# Patient Record
Sex: Male | Born: 1980 | Race: White | Hispanic: No | Marital: Married | Smoking: Never smoker
Health system: Southern US, Community
[De-identification: ages and names within clinical notes are randomized; demographics above are authoritative.]

## PROBLEM LIST (undated history)

## (undated) DIAGNOSIS — K654 Sclerosing mesenteritis: Secondary | ICD-10-CM

## (undated) DIAGNOSIS — F419 Anxiety disorder, unspecified: Secondary | ICD-10-CM

## (undated) HISTORY — PX: HERNIA REPAIR: SHX51

---

## 2020-01-26 ENCOUNTER — Encounter (HOSPITAL_BASED_OUTPATIENT_CLINIC_OR_DEPARTMENT_OTHER): Payer: Self-pay

## 2020-01-26 ENCOUNTER — Emergency Department (HOSPITAL_BASED_OUTPATIENT_CLINIC_OR_DEPARTMENT_OTHER): Payer: Managed Care, Other (non HMO) | Attending: Physician Assistant

## 2020-01-26 ENCOUNTER — Other Ambulatory Visit: Payer: Self-pay

## 2020-01-26 ENCOUNTER — Emergency Department (HOSPITAL_BASED_OUTPATIENT_CLINIC_OR_DEPARTMENT_OTHER)
Admission: EM | Admit: 2020-01-26 | Discharge: 2020-01-26 | Disposition: A | Discharge status: Home / Self Care | Payer: Worker's Compensation | Attending: Emergency Medicine | Admitting: Emergency Medicine

## 2020-01-26 DIAGNOSIS — Z79899 Other long term (current) drug therapy: Secondary | ICD-10-CM | POA: Diagnosis not present

## 2020-01-26 DIAGNOSIS — R42 Dizziness and giddiness: Secondary | ICD-10-CM | POA: Diagnosis not present

## 2020-01-26 DIAGNOSIS — Y929 Unspecified place or not applicable: Secondary | ICD-10-CM | POA: Insufficient documentation

## 2020-01-26 DIAGNOSIS — S0990XA Unspecified injury of head, initial encounter: Secondary | ICD-10-CM

## 2020-01-26 DIAGNOSIS — Y939 Activity, unspecified: Secondary | ICD-10-CM | POA: Diagnosis not present

## 2020-01-26 DIAGNOSIS — Y990 Civilian activity done for income or pay: Secondary | ICD-10-CM | POA: Diagnosis not present

## 2020-01-26 DIAGNOSIS — W228XXA Striking against or struck by other objects, initial encounter: Secondary | ICD-10-CM | POA: Insufficient documentation

## 2020-01-26 DIAGNOSIS — S0081XA Abrasion of other part of head, initial encounter: Secondary | ICD-10-CM | POA: Insufficient documentation

## 2020-01-26 HISTORY — DX: Sclerosing mesenteritis: K65.4

## 2020-01-26 HISTORY — DX: Anxiety disorder, unspecified: F41.9

## 2020-01-26 MED ORDER — ACETAMINOPHEN 325 MG PO TABS
650.0000 mg | ORAL_TABLET | Freq: Once | ORAL | Status: AC
  Administered 2020-01-26: 15:00:00 650 mg via ORAL
  Filled 2020-01-26: qty 2

## 2020-01-26 NOTE — ED Provider Notes (Signed)
[place] EMERGENCY DEPARTMENT Provider Note   CSN: [id number] Arrival date & time: 01/26/20  1234     History Chief Complaint  Patient presents with  . Head Injury    Vincent Stone is a 39 y.o. male with past medical history significant for mesenteric panniculitis presents to emergency department today with chief complaint of head injury. Onset was acute happening just prior to arrival. Patient states he was at work wearing a hard hat when he lost control of a drill and it hit him in his left forehead. That caused him to be off balance and he fell hitting the right side of his face on a forklift. He denies falling to the ground. He reports a headache started immediately after the injury and has progressively worsened. He rates the pain 7/10 in severity. He is also endorsing pain form the right temple radiating to the right mandible that is worse with movement. He describes pain as an aching sensation. He reports feeling dizzy when ambulating after the injury.  He has not taken any medications for pain prior to arrival. He denies any neck pain, blurry vision, jaw malocclusion, nausea, vomiting, weakness, numbness. He reports tetanus is UTD. Patient is not anticoagulated.  History provided by patient with additional history obtained from chart review.     Past Medical History:  Diagnosis Date  . Anxiety   . Mesenteric panniculitis ([place])     There are no problems to display for this patient.   Past Surgical History:  Procedure Laterality Date  . HERNIA REPAIR         No family history on file.  Social History   Tobacco Use  . Smoking status: Never Smoker  . Smokeless tobacco: Never Used  Substance Use Topics  . Alcohol use: Never  . Drug use: Never    Home Medications Prior to Admission medications   Medication Sig Start Date End Date Taking? Authorizing Provider  Adalimumab (HUMIRA PEN) 40 MG/0.4ML PNKT Inject into the skin. 12/13/19  Yes [provider]  escitalopram (LEXAPRO) 20 MG tablet Take by mouth. 09/20/19  Yes [provider]  dicyclomine (BENTYL) 10 MG capsule Take by mouth.    [provider]  hyoscyamine (ANASPAZ) 0.125 MG TBDP disintergrating tablet Take by mouth.    [provider]  naproxen sodium (ALEVE) 220 MG tablet Take by mouth.    [provider]  ondansetron (ZOFRAN) 8 MG tablet Take 8 mg by mouth every 8 (eight) hours as needed. 12/13/19   [provider]  predniSONE (DELTASONE) 10 MG tablet PREDNISONE 4 TABS IN THE MORNING X 3 DAYS, 3 TABS X 3 DAYS, 2 TABSX 3 DAYS, 1 TABX 3 DAYS AND STOP 01/05/20   [provider]    Allergies    Patient has no known allergies.  Review of Systems   Review of Systems All other systems are reviewed and are negative for acute change except as noted in the HPI.  Physical Exam Updated Vital Signs BP (!) 146/109 (BP Location: Left Arm)   Pulse 79   Temp 98.1 F (36.7 C) (Oral)   Resp 18   Ht 5\' 6"  (1.676 m)   Wt 99.8 kg   SpO2 99%   BMI 35.51 kg/m   Physical Exam Vitals and nursing note reviewed.  Constitutional:      Appearance: He is well-developed. He is not ill-appearing or toxic-appearing.  HENT:     Head: Normocephalic and atraumatic.  Nose: Nose normal.  Eyes:     General: No scleral icterus.       Right eye: No discharge.        Left eye: No discharge.     Conjunctiva/sclera: Conjunctivae normal.  Neck:     Vascular: No JVD.     Comments: Full ROM intact without spinous process TTP. No bony stepoffs or deformities, no paraspinous muscle TTP or muscle spasms. No rigidity or meningeal signs. No bruising, erythema, or swelling.  Cardiovascular:     Rate and Rhythm: Normal rate and regular rhythm.     Pulses: Normal pulses.     Heart sounds: Normal heart sounds.  Pulmonary:     Effort: Pulmonary effort is normal.     Breath sounds: Normal breath sounds.  Abdominal:     General: There is no distension.   Musculoskeletal:        General: Normal range of motion.     Cervical back: Normal range of motion.  Skin:    General: Skin is warm and dry.     Capillary Refill: Capillary refill takes less than 2 seconds.  Neurological:     Mental Status: He is oriented to person, place, and time.     GCS: GCS eye subscore is 4. GCS verbal subscore is 5. GCS motor subscore is 6.     Comments: Fluent speech, no facial droop.  Speech is clear and goal oriented, follows commands CN III-XII intact, no facial droop Normal strength in upper and lower extremities bilaterally including dorsiflexion and plantar flexion, strong and equal grip strength Sensation normal to light and sharp touch Moves extremities without ataxia, coordination intact Normal finger to nose and rapid alternating movements Normal gait and balance   Psychiatric:        Behavior: Behavior normal.     ED Results / Procedures / Treatments   Labs (all labs ordered are listed, but only abnormal results are displayed) Labs Reviewed - No data to display  EKG None  Radiology CT Head Wo Contrast  Result Date: 01/26/2020 CLINICAL DATA:  Head trauma EXAM: CT HEAD WITHOUT CONTRAST TECHNIQUE: Contiguous axial images were obtained from the base of the skull through the vertex without intravenous contrast. COMPARISON:  None. FINDINGS: Brain: There is no acute intracranial hemorrhage, mass-effect, or edema. Gray-white differentiation is preserved. There is no extra-axial fluid collection. Ventricles and sulci are within normal limits in size and configuration. Vascular: No hyperdense vessel or unexpected calcification. Skull: Calvarium is unremarkable. Sinuses/Orbits: No acute finding. Other: None. IMPRESSION: No evidence of acute intracranial injury. Electronically Signed   By: Guadlupe Spanish M.D.   On: 01/26/2020 15:14   CT Maxillofacial Wo Contrast  Result Date: 01/26/2020 CLINICAL DATA:  Facial trauma EXAM: CT MAXILLOFACIAL WITHOUT  CONTRAST TECHNIQUE: Multidetector CT imaging of the maxillofacial structures was performed. Multiplanar CT image reconstructions were also generated. COMPARISON:  None. FINDINGS: Osseous: There is no acute fracture. Temporomandibular joints are unremarkable. Orbits: No intraorbital hematoma. Sinuses: Minimal mucosal thickening. Soft tissues: Unremarkable. Limited intracranial: See separate dictation. IMPRESSION: No acute facial fracture. Electronically Signed   By: Guadlupe Spanish M.D.   On: 01/26/2020 15:18    Procedures Procedures (including critical care time)  Medications Ordered in ED Medications  acetaminophen (TYLENOL) tablet 650 mg (650 mg Oral Given 01/26/20 1445)    ED Course  I have reviewed the triage vital signs and the nursing notes.  Pertinent labs & imaging results that were available during my care of  the patient were reviewed by me and considered in my medical decision making (see chart for details).    MDM Rules/Calculators/A&P                      Patient seen and examined. Patient presents awake, alert, hemodynamically stable, afebrile, non toxic. He has headache and facial pain after trauma.  On exam he has a very small superficial abrasion to right forehead without bleeding.  He states his tetanus is up-to-date.  Neuro exam is normal.  He does have tenderness palpation over his right temple and has pain when opening and closing his mouth.  No malocclusion.  No loose teeth noted.  Does have mild swelling noted to the right side of his face.He denies fall and LOC, but with concerning mechanism will proceed with CT head. With concern for mandible injury given the amount of tenderness he has on exam, will also get CT maxillofacial . Ct head and maxillofacial are negative for acute findings. No fractures seen. Patient given tylenol for pain.  The patient appears reasonably screened and/or stabilized for discharge and I doubt any other medical condition or other [facility] requiring  further screening, evaluation, or treatment in the ED at this time prior to discharge. The patient is safe for discharge with strict return precautions discussed. Recommend pcp/concussion clinic follow up.   Portions of this note were generated with Scientist, clinical (histocompatibility and immunogenetics). Dictation errors may occur despite best attempts at proofreading.    Final Clinical Impression(s) / ED Diagnoses Final diagnoses:  Injury of head, initial encounter    Rx / DC Orders ED Discharge Orders    None       Kathyrn Lass 01/26/20 1541    Raeford Razor, MD 01/27/20 1116

## 2020-01-26 NOTE — Discharge Instructions (Addendum)
You were seen in the emergency department today following a head injury.  We suspect that you have a concussion, otherwise known and as a mild traumatic brain injury.  Your  CT scan did not show any new abnormality such as a brain bleed.   1. Medications: Ibuprofen or Tylenol for pain 2. Treatment: Rest, ice on head.  Concussion precautions given - keep patient in a quiet, not simulating, dark environment. No TV, computer use, video games until headache is resolved completely. No contact sports until cleared by the primary care provider or pediatrician. 3. Follow Up: With primary care physician in 2-3 days if headache persists.  Return to the emergency department if patient becomes lethargic, begins vomiting , develops double vision, speech difficulty, problems walking or other change in mental status.  We would like you to follow-up with the [facility] [place] sports [facility] concussion clinic, contact information below:  Address: [address], [place], [place] [address] Phone: [phone]  Per [facility] Website:   What to Expect: Evaluations at the Concussion Clinic All patients at the Concussion Clinic are given an extensive three-part evaluation that includes: a computerized test to measure memory, visual processing speed, and reaction time a test that measures the systems that integrate movement, balance, and vision an in-depth review of a detailed symptoms checklist for signs of concussion The evaluation process is critical for the treatment and recovery of a concussed patient as no two concussions are alike. Thankfully, the diagnostic tools that trained professionals use can help to better manage head injuries.  Part of the technology the doctors and staff at [facility] use in their assessments is a computerized examination called ImPACT. This tool uses six tasks to measure memory, visual processing speed, and reaction time. By analyzing the results of the  examination and comparing them to average responses or a baseline score for a patient, our staff can make an informed judgment about the patient's cognitive functions. In addition to the ImPACT examination, patients are given a Vestibular Ocular Motor Screening test. This is a simple and painless test that focuses on the systems that integrate a patient's movement, balance, and vision.  These tests are used in conjunction with a thorough review of a detailed symptoms checklist to complete the patient's evaluation and develop a treatment plan.  You do not need a referral, and you can book an appointment online. Our Concussion Hotline is staffed by trained professionals during our regular office hours: Monday - Thursday from 7:30 AM to 4:30 PM, and Fridays from 7:30 AM to 12:00 PM, and the number to call is [phone]. The Concussion Clinic team meets with patients at our [facility] office. Call today.   Further ED Instructions:   Please call and follow-up within the concussion clinic as well as your primary care provider within the next 3 to 5 days.  In the meantime we would like you to avoid strenuous/over exertional activities such as sports or running.  Please avoid excess screen time utilizing cell phones, computers, or the TV.  Please avoid activities that require significant amount of concentration.  Please try to rest as much as possible.  Please take Tylenol and/or Motrin per over-the-counter dosing instructions for any continued discomfort.  Return to the ER for new or worsening symptoms or any other concerns that you may have.

## 2020-01-26 NOTE — ED Triage Notes (Signed)
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

## 2021-08-23 IMAGING — CT CT HEAD W/O CM
3 series · 16 of 47 positions shown, 19 images · non-contrast
Comparison: None.

CLINICAL DATA: Head trauma

EXAM:
CT HEAD WITHOUT CONTRAST
TECHNIQUE: Contiguous axial images were obtained from the base of the skull
through the vertex without intravenous contrast.

[Series 2: head wo · axial · 0.46mm/px · z∈[-201,-56]mm · 10 of 35 slices shown, 13 images]
[im 3/35  brain]
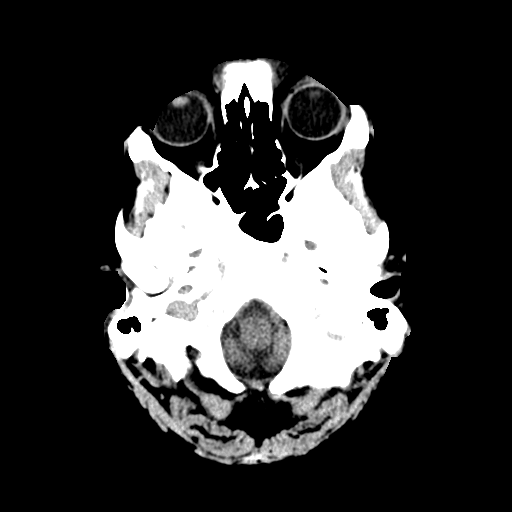
[im 3/35  bone]
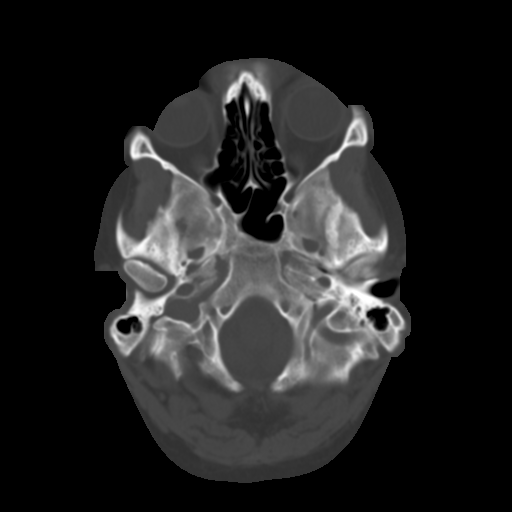
[im 6/35  brain]
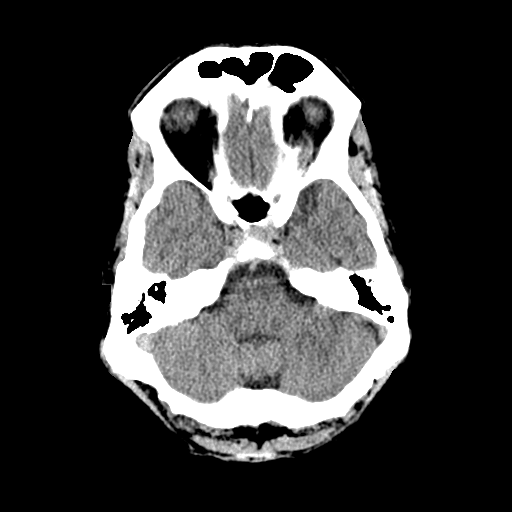
[im 10/35  brain]
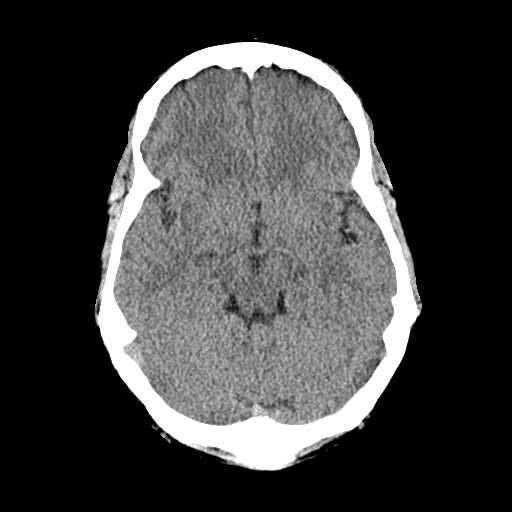
[im 12/35  brain]
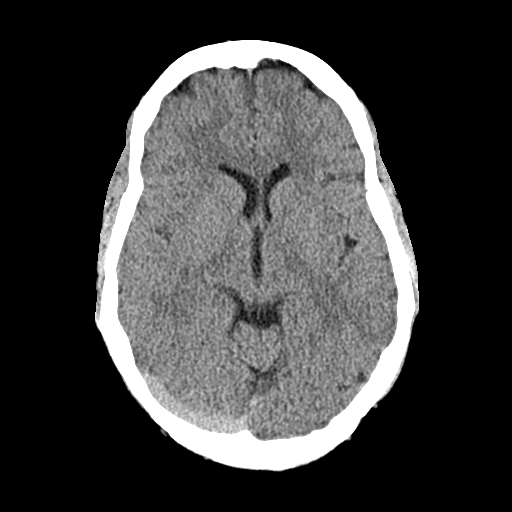
[im 16/35  brain]
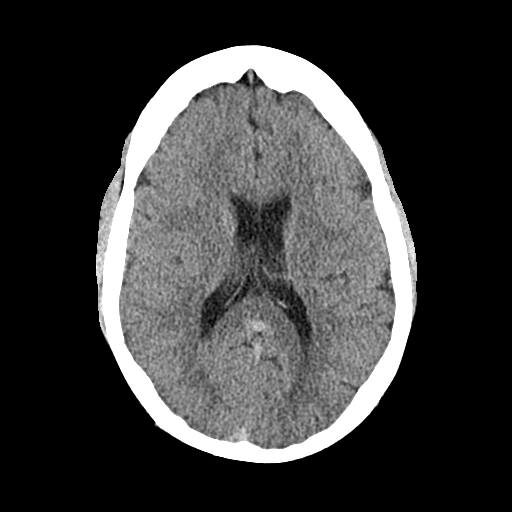
[im 16/35  bone]
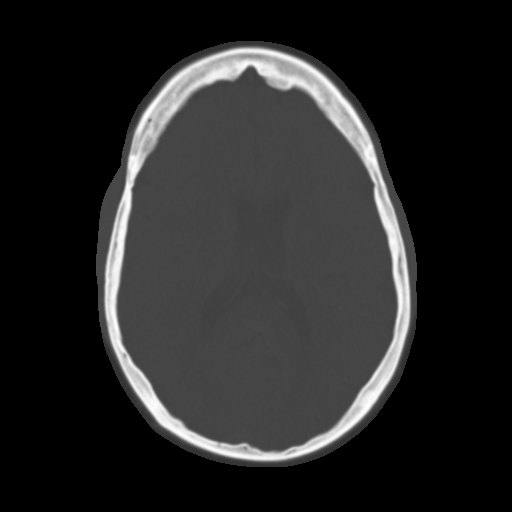
[im 19/35  brain]
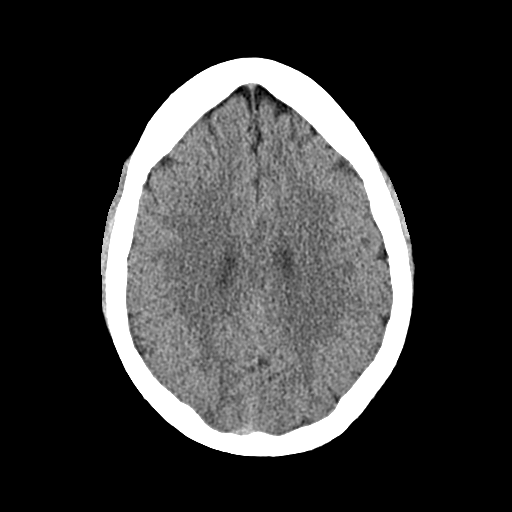
[im 23/35  brain]
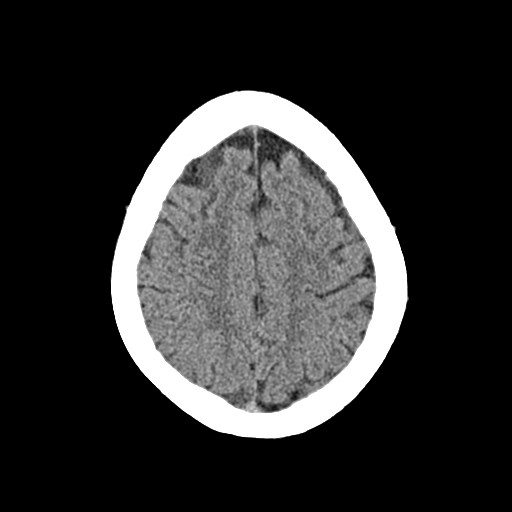
[im 26/35  brain]
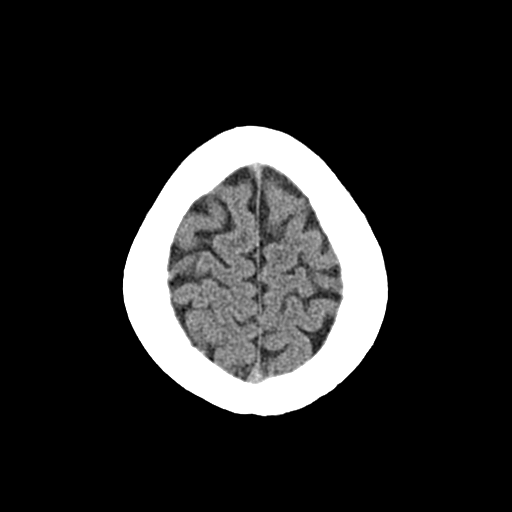
[im 29/35  brain]
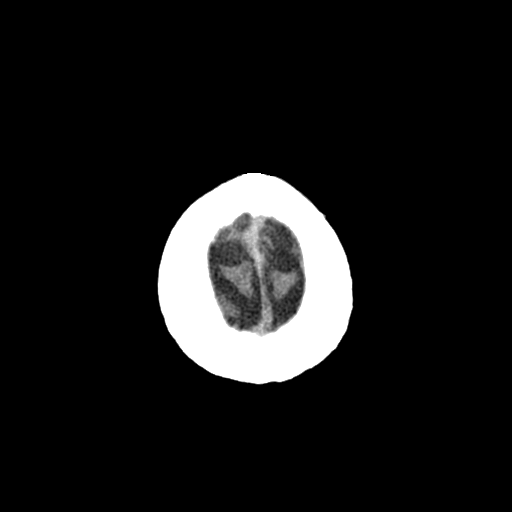
[im 29/35  bone]
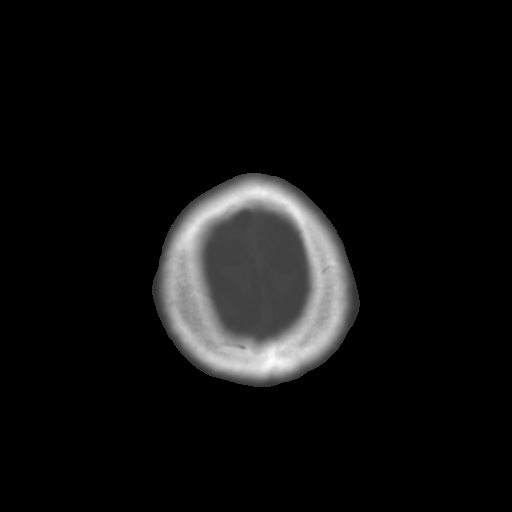
[im 32/35  brain]
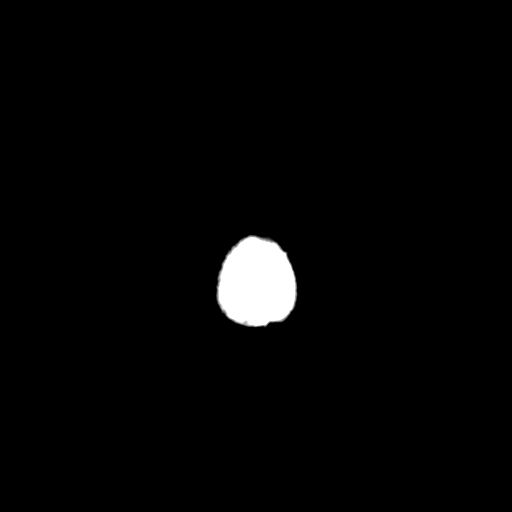

[Series 4: cor head wo · coronal · 0.36mm/px · 3 of 73 slices shown]
[im 25/73  brain]
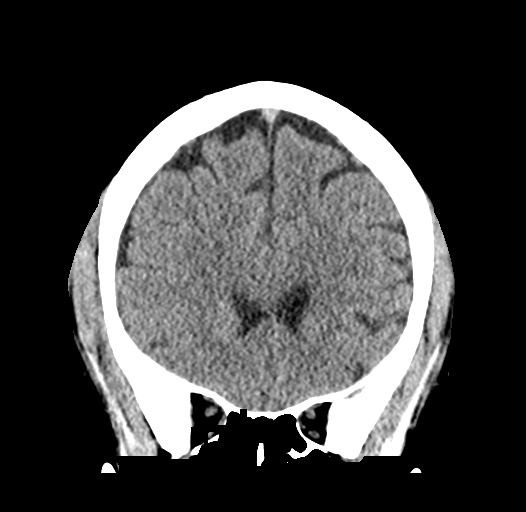
[im 33/73  brain]
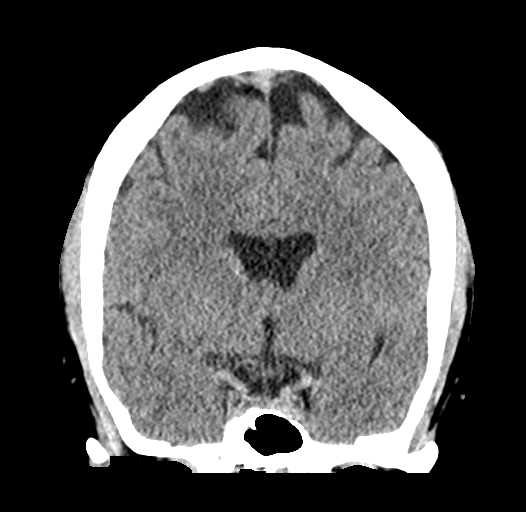
[im 41/73  brain]
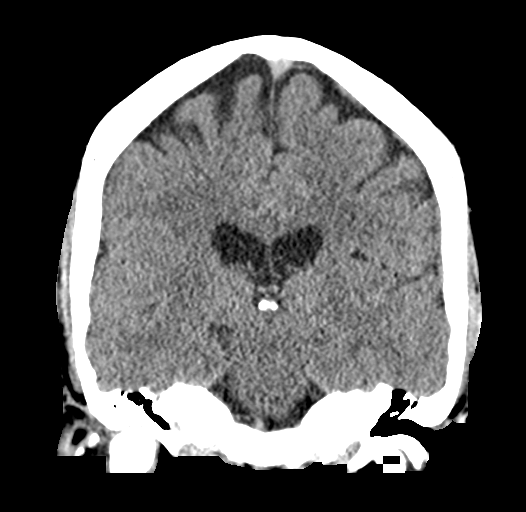

[Series 5: sag head wo · sagittal · 0.36mm/px · 3 of 58 slices shown]
[im 20/58  brain]
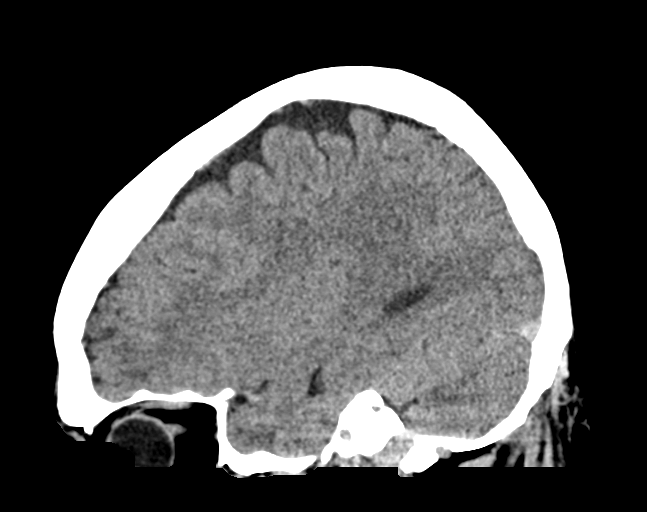
[im 29/58  brain]
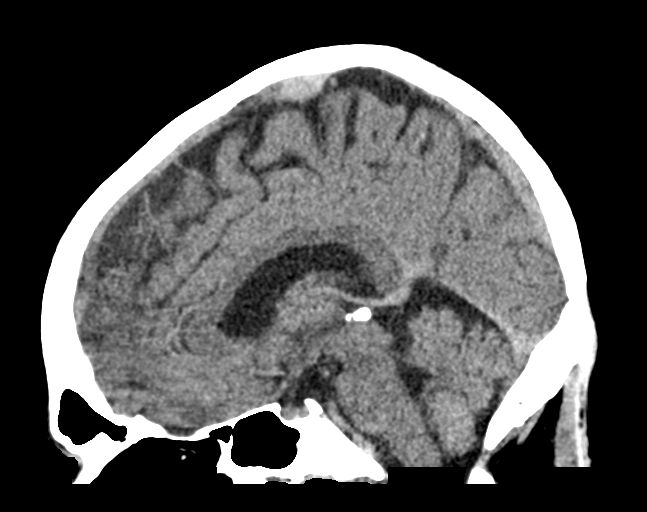
[im 39/58  brain]
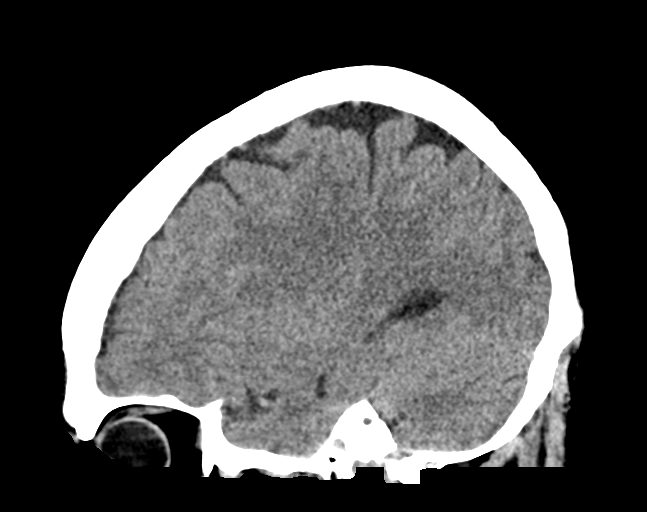

[16 of 47 positions shown; findings below may reference images not displayed]

FINDINGS: Brain: There is no acute intracranial hemorrhage, mass-effect, or
edema. Gray-white differentiation is preserved. There is no
extra-axial fluid collection. Ventricles and sulci are within normal
limits in size and configuration.

Vascular: No hyperdense vessel or unexpected calcification.

Skull: Calvarium is unremarkable.

Sinuses/Orbits: No acute finding.

Other: None.
IMPRESSION: No evidence of acute intracranial injury.
